# Patient Record
Sex: Male | Born: 2005 | Race: White | Hispanic: No | Marital: Single | Smoking: Never smoker
Health system: Southern US, Community
[De-identification: ages and names within clinical notes are randomized; demographics above are authoritative.]

---
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
## 2024-07-19 ENCOUNTER — Ambulatory Visit (INDEPENDENT_AMBULATORY_CARE_PROVIDER_SITE_OTHER): Admitting: Urology

## 2024-07-19 VITALS — BP 138/68 | HR 90 | Ht 69.0 in | Wt 170.0 lb

## 2024-07-19 DIAGNOSIS — N529 Male erectile dysfunction, unspecified: Secondary | ICD-10-CM | POA: Diagnosis not present

## 2024-07-19 MED ORDER — TADALAFIL 5 MG PO TABS: 5.0000 mg | ORAL_TABLET | Freq: Every day | ORAL | 11 refills | Status: AC

## 2024-07-19 NOTE — Patient Instructions (Signed)
 The Benefits of a Plant-Based Diet for Urology Health  A plant-based diet emphasizes the consumption of whole, unprocessed plant foods while minimizing or excluding animal products including meat and dairy products. This dietary approach has gained attention for its potential to promote overall health, including urology-related conditions. Incorporating a plant-based diet into your lifestyle can offer numerous benefits for maintaining optimal urology health.  1. Reduced Risk of Kidney Stones: A plant-based diet is typically rich in fruits, vegetables, legumes, and whole grains. These foods are high in dietary fiber, potassium, and magnesium, which can help reduce the risk of developing kidney stones. Be careful to avoid high quantities of spinach, as these can contribute to kidney stone formation if eaten in large volumes. The increased intake of water-soluble fiber can enhance the excretion of waste products and prevent the crystallization of minerals that lead to stone formation.  2. Improved Prostate Health: Studies have suggested a link between the consumption of red and processed meats and an increased risk of prostate problems, including benign prostatic hyperplasia (BPH) and prostate cancer. By adopting a plant-based diet, you can lower your intake of saturated fats and decrease the risk of these conditions. PSA levels can often decrease on plant based diets! Plant foods are also rich in antioxidants and phytochemicals that have been associated with prostate health.  3. Better Bladder Function: A diet focused on plant-based foods can contribute to better bladder health by reducing the risk of urinary tract infections (UTIs). Berries, citrus fruits, and leafy greens are known for their high vitamin C content, which can acidify urine and create an environment less favorable for bacteria growth. Additionally, plant-based diets are generally lower in sodium, which can help prevent fluid retention and  reduce the strain on the bladder.  4. Management of Erectile Dysfunction (ED): Some research suggests that a plant-based diet can positively impact erectile function. Plant-based diets are associated with improved cardiovascular health, which is crucial for maintaining healthy blood flow and nerve function required for proper erectile function. By reducing the consumption of high-cholesterol and high-saturated fat animal products, a plant-based diet may contribute to a decreased risk of ED.  5. Prevention of Chronic Conditions: A plant-based diet can help prevent or manage chronic conditions such as obesity, diabetes, and hypertension. These conditions can contribute to urology-related issues, including urinary incontinence and kidney dysfunction. By maintaining a healthy weight and managing these conditions, you can reduce the risk of urology-related complications.  Conclusion: Embracing a plant-based diet can offer significant benefits for urology health. By incorporating a variety of colorful fruits, vegetables, whole grains, nuts, seeds, and legumes into your meals, you can support kidney health, prostate health, bladder function, and overall well-being. Remember to consult with a healthcare professional or registered dietitian before making any significant dietary changes, especially if you have existing health conditions. Your personalized approach to a plant-based diet can contribute to improved urology health and enhance your quality of life.

## 2024-07-19 NOTE — Progress Notes (Signed)
   07/19/24 4:28 PM   Jeremy JAYSON Hoop Jr. Apr 19, 2006 [id number]  CC: Low libido, ED, premature ejaculation  HPI: 18 year old male who reports at least 6 months of the above symptoms.  He has a history of anxiety but feels like those symptoms have been better recently.  In the same relationship for approximately 2 years, previously did not have problems with erections or premature ejaculation.  Has never tried medications for erection.  He had a afternoon 2:30 PM testosterone checked that was 317 from October 2025.  Family History: No family history on file.  Social History:  reports that he has never smoked. He has never used smokeless tobacco. He reports that he does not drink alcohol and does not use drugs.  Physical Exam: BP 138/68 (BP Location: Left Arm, Patient Position: Sitting, Cuff Size: Normal)   Pulse 90   Ht 5' 9 (1.753 m)   Wt 170 lb (77.1 kg)   SpO2 99%   BMI 25.10 kg/m    Constitutional:  Alert and oriented, No acute distress. Cardiovascular: No clubbing, cyanosis, or edema. Respiratory: Normal respiratory effort, no increased work of breathing. GI: Abdomen is soft, nontender, nondistended, no abdominal masses   Laboratory Data: Reviewed, see HPI  Assessment & Plan:   18 year old male with 6 months of low libido, ED, premature ejaculation.  It sounds like primary issue is with erections that are causing premature ejaculation, and I recommended starting with a trial of Cialis.  We reviewed his testosterone levels, as well as the AUA guidelines that require 2 levels below 300 to consider testosterone replacement.  Discussed testosterone replacement would not be a good option with his young age based on impact on fertility.  He had questions about Clomid today, we discussed that is an off-label medication, and I would not feel comfortable prescribing that with his young age and normal testosterone level.  We reviewed behavioral strategies to increase testosterone  including avoiding smoking, healthy eating, adequate sleep, decreasing stress, weight lifting.  Trial of Cialis daily Behavioral strategies discussed regarding energy levels RTC 2 months morning testosterone prior   Redell Burnet, MD 07/19/2024  [facility] [address] [place], [place] [address] [phone]

## 2024-08-09 ENCOUNTER — Other Ambulatory Visit

## 2024-08-16 ENCOUNTER — Ambulatory Visit: Admitting: Urology

## 2024-08-17 ENCOUNTER — Encounter: Payer: Self-pay | Admitting: Urology
